# Patient Record
Sex: Male | Born: 1960 | Hispanic: Yes | Marital: Married | Smoking: Current every day smoker
Health system: Southern US, Community
[De-identification: ages and names within clinical notes are randomized; demographics above are authoritative.]

## PROBLEM LIST (undated history)

## (undated) HISTORY — PX: CHOLECYSTECTOMY: SHX55

---

## 2020-04-28 ENCOUNTER — Encounter: Payer: Self-pay | Admitting: Emergency Medicine

## 2020-04-28 ENCOUNTER — Emergency Department (INDEPENDENT_AMBULATORY_CARE_PROVIDER_SITE_OTHER): Payer: 59

## 2020-04-28 ENCOUNTER — Other Ambulatory Visit: Payer: Self-pay

## 2020-04-28 ENCOUNTER — Emergency Department (INDEPENDENT_AMBULATORY_CARE_PROVIDER_SITE_OTHER)
Admission: EM | Admit: 2020-04-28 | Discharge: 2020-04-28 | Disposition: A | Discharge status: Home / Self Care | Payer: 59 | Source: Home / Self Care

## 2020-04-28 DIAGNOSIS — R112 Nausea with vomiting, unspecified: Secondary | ICD-10-CM

## 2020-04-28 DIAGNOSIS — R1084 Generalized abdominal pain: Secondary | ICD-10-CM | POA: Diagnosis not present

## 2020-04-28 DIAGNOSIS — R197 Diarrhea, unspecified: Secondary | ICD-10-CM

## 2020-04-28 DIAGNOSIS — R300 Dysuria: Secondary | ICD-10-CM | POA: Diagnosis not present

## 2020-04-28 DIAGNOSIS — K8000 Calculus of gallbladder with acute cholecystitis without obstruction: Secondary | ICD-10-CM

## 2020-04-28 LAB — POCT CBC W AUTO DIFF (K'VILLE URGENT CARE)

## 2020-04-28 LAB — POCT URINALYSIS DIP (MANUAL ENTRY)
Bilirubin, UA: NEGATIVE
Blood, UA: NEGATIVE
Glucose, UA: NEGATIVE mg/dL
Ketones, POC UA: NEGATIVE mg/dL
Leukocytes, UA: NEGATIVE
Nitrite, UA: NEGATIVE
Protein Ur, POC: 30 mg/dL — AB
Spec Grav, UA: >=1.03 — AB (ref 1.010–1.025)
Urobilinogen, UA: 1 E.U./dL
pH, UA: 5.5 (ref 5.0–8.0)

## 2020-04-28 LAB — I-STAT CREATININE (MANUAL ENTRY): Creatinine, Ser: 1.4 — AB (ref 0.50–1.10)

## 2020-04-28 MED ORDER — IOHEXOL 300 MG/ML  SOLN
100.0000 mL | Freq: Once | INTRAMUSCULAR | Status: AC | PRN
Start: 2020-04-28 — End: 2020-04-28
  Administered 2020-04-28: 100 mL via INTRAVENOUS

## 2020-04-28 NOTE — ED Triage Notes (Signed)
Emesis x 2 days lower abdominal pain, constipation, no taste today

## 2020-04-28 NOTE — ED Provider Notes (Signed)
[facility]    CSN: [id number] Arrival date & time: 04/28/20  1419      History   Chief Complaint Chief Complaint  Patient presents with  . Emesis    HPI [name] is a 59 y.o. male.   HPI [name] is a 59 y.o. male presenting to UC with c/o vomiting 7 times over the last 48 hours, generalized abdominal pain and has not had a normal BP for 4 days. Pt feels constipated with a small amount of hard stool this morning.  He has not taken anything for his symptoms. He reports altered taste due to the vomiting. Denies cough, congestion, fever, chills, chest pain or SOB. No sick contacts or recent travel.    History reviewed. No pertinent past medical history.  There are no problems to display for this patient.   History reviewed. No pertinent surgical history.     Home Medications    Prior to Admission medications   Medication Sig Start Date End Date Taking? Authorizing Provider  bismuth subsalicylate (PEPTO BISMOL) 262 MG/15ML suspension Take 30 mLs by mouth every 6 (six) hours as needed.   Yes [provider]    Family History Family History  Problem Relation Age of Onset  . Diabetes Mother     Social History Social History   Tobacco Use  . Smoking status: Current Every Day Smoker    Packs/day: 1.00    Types: Cigarettes  . Smokeless tobacco: Never Used  Vaping Use  . Vaping Use: Never used  Substance Use Topics  . Alcohol use: Yes  . Drug use: Never     Allergies   Patient has no known allergies.   Review of Systems Review of Systems   Physical Exam Triage Vital Signs ED Triage Vitals  Enc Vitals Group     BP 04/28/20 1438 (!) 145/89     Pulse Rate 04/28/20 1438 (!) 101     Resp --      Temp 04/28/20 1438 99 F (37.2 C)     Temp Source 04/28/20 1438 Oral     SpO2 04/28/20 1438 96 %     Weight 04/28/20 1439 217 lb (98.4 kg)     Height 04/28/20 1439 5\' 6"  (1.676 m)     Head Circumference --      Peak Flow --       Pain Score 04/28/20 1438 5     Pain Loc --      Pain Edu? --      Excl. in GC? --    No data found.  Updated Vital Signs BP (!) 145/89 (BP Location: Right Arm)   Pulse (!) 101   Temp 99 F (37.2 C) (Oral)   Ht 5\' 6"  (1.676 m)   Wt 217 lb (98.4 kg)   SpO2 96%   BMI 35.02 kg/m   Visual Acuity Right Eye Distance:   Left Eye Distance:   Bilateral Distance:    Right Eye Near:   Left Eye Near:    Bilateral Near:     Physical Exam   UC Treatments / Results  Labs (all labs ordered are listed, but only abnormal results are displayed) Labs Reviewed  POCT URINALYSIS DIP (MANUAL ENTRY) - Abnormal; Notable for the following components:      Result Value   Color, UA other (*)    Spec Grav, UA >=1.030 (*)    Protein Ur, POC =30 (*)    All other components within  normal limits  URINE CULTURE  LIPASE  COMPLETE METABOLIC PANEL WITH GFR  POCT CBC W AUTO DIFF ([facility])    EKG   Radiology CT ABDOMEN PELVIS W CONTRAST  Result Date: 04/28/2020 CLINICAL DATA:  59 year old male with abdominal pain. Concern for acute diverticulitis. EXAM: CT ABDOMEN AND PELVIS WITH CONTRAST TECHNIQUE: Multidetector CT imaging of the abdomen and pelvis was performed using the standard protocol following bolus administration of intravenous contrast. CONTRAST:  142mL OMNIPAQUE IOHEXOL 300 MG/ML  SOLN COMPARISON:  None. FINDINGS: Lower chest: There are bibasilar linear atelectasis/scarring. The visualized lung bases are otherwise clear. No intra-abdominal free air or free fluid. Hepatobiliary: Diffuse fatty infiltration of the liver. No intrahepatic biliary ductal dilatation. Faint slightly high attenuating content within the gallbladder may represent noncalcified stones. There is mild thickening of the gallbladder wall versus trace pericholecystic fluid. Further evaluation with ultrasound recommended. Pancreas: Unremarkable. No pancreatic ductal dilatation or surrounding inflammatory changes.  Spleen: Normal in size without focal abnormality. Adrenals/Urinary Tract: The adrenal glands are unremarkable. The kidneys, visualized ureters, and urinary bladder appear unremarkable. Stomach/Bowel: There is slight focal protrusion of the sigmoid colon into the left inguinal canal. There is no bowel obstruction or active inflammation. The appendix is normal. Vascular/Lymphatic: The abdominal aorta and IVC unremarkable. No portal venous gas. There is no adenopathy. Reproductive: The prostate and seminal vesicles are grossly unremarkable. No pelvic mass. Other: Small fat containing bilateral inguinal hernias. Musculoskeletal: Degenerative changes of the spine. No acute osseous pathology. IMPRESSION: 1. Probable noncalcified gallstones with findings concerning for mild or early acute cholecystitis. Further evaluation with ultrasound recommended. 2. No bowel obstruction. Normal appendix. 3. Fatty liver. Electronically Signed   By: Anner Crete M.D.   On: 04/28/2020 17:23    Procedures Procedures (including critical care time)  Medications Ordered in UC Medications - No data to display  Initial Impression / Assessment and Plan / UC Course  I have reviewed the triage vital signs and the nursing notes.  Pertinent labs & imaging results that were available during my care of the patient were reviewed by me and considered in my medical decision making (see chart for details).     Discussed imaging with pt Encouraged further evaluation and treatment in the emergency department this evening. Pt understanding and agreeable to go to the hospital this evening. He feels well enough to drive himself, declined EMS transport. Pt appears well, NAD. AVS provided.  Final Clinical Impressions(s) / UC Diagnoses   Final diagnoses:  Dysuria  Nausea vomiting and diarrhea  Calculus of gallbladder with acute cholecystitis without obstruction     Discharge Instructions      Your imaging is concerning for  an infected gallbladder, which would likely need to be surgically removed. It is recommended you go to the emergency department this evening for further evaluation with an ultrasound and possible surgery. Do not eat or drink anything until you are further evaluation in the emergency department in case surgery is needed.     ED Prescriptions    None     PDMP not reviewed this encounter.   Noe Gens, [place] 04/28/20 1808

## 2020-04-28 NOTE — Discharge Instructions (Signed)
  Your imaging is concerning for an infected gallbladder, which would likely need to be surgically removed. It is recommended you go to the emergency department this evening for further evaluation with an ultrasound and possible surgery. Do not eat or drink anything until you are further evaluation in the emergency department in case surgery is needed.

## 2020-04-29 LAB — COMPLETE METABOLIC PANEL WITH GFR
AG Ratio: 1.4 (calc) (ref 1.0–2.5)
ALT: 30 U/L (ref 9–46)
AST: 18 U/L (ref 10–35)
Albumin: 4.3 g/dL (ref 3.6–5.1)
Alkaline phosphatase (APISO): 76 U/L (ref 35–144)
BUN/Creatinine Ratio: 16 (calc) (ref 6–22)
BUN: 21 mg/dL (ref 7–25)
CO2: 21 mmol/L (ref 20–32)
Calcium: 9.5 mg/dL (ref 8.6–10.3)
Chloride: 104 mmol/L (ref 98–110)
Creat: 1.32 mg/dL — ABNORMAL HIGH (ref 0.70–1.25)
GFR, Est African American: 67 mL/min/{1.73_m2} (ref >=60)
GFR, Est Non African American: 58 mL/min/{1.73_m2} — ABNORMAL LOW (ref >=60)
Globulin: 3.1 g/dL (calc) (ref 1.9–3.7)
Glucose, Bld: 140 mg/dL — ABNORMAL HIGH (ref 65–99)
Potassium: 3.8 mmol/L (ref 3.5–5.3)
Sodium: 138 mmol/L (ref 135–146)
Total Bilirubin: 1.1 mg/dL (ref 0.2–1.2)
Total Protein: 7.4 g/dL (ref 6.1–8.1)

## 2020-04-29 LAB — LIPASE: Lipase: 18 U/L (ref 7–60)

## 2020-04-30 LAB — URINE CULTURE
MICRO NUMBER:: 10616854
SPECIMEN QUALITY:: ADEQUATE

## 2020-12-26 ENCOUNTER — Emergency Department (INDEPENDENT_AMBULATORY_CARE_PROVIDER_SITE_OTHER)
Admission: EM | Admit: 2020-12-26 | Discharge: 2020-12-26 | Disposition: A | Discharge status: Home / Self Care | Payer: Self-pay | Source: Home / Self Care

## 2020-12-26 ENCOUNTER — Emergency Department (INDEPENDENT_AMBULATORY_CARE_PROVIDER_SITE_OTHER): Payer: 59

## 2020-12-26 ENCOUNTER — Other Ambulatory Visit: Payer: Self-pay

## 2020-12-26 DIAGNOSIS — M542 Cervicalgia: Secondary | ICD-10-CM | POA: Diagnosis not present

## 2020-12-26 DIAGNOSIS — S161XXA Strain of muscle, fascia and tendon at neck level, initial encounter: Secondary | ICD-10-CM

## 2020-12-26 DIAGNOSIS — M545 Low back pain, unspecified: Secondary | ICD-10-CM

## 2020-12-26 DIAGNOSIS — S39012A Strain of muscle, fascia and tendon of lower back, initial encounter: Secondary | ICD-10-CM

## 2020-12-26 MED ORDER — CYCLOBENZAPRINE HCL 10 MG PO TABS
10.0000 mg | ORAL_TABLET | Freq: Two times a day (BID) | ORAL | 0 refills | Status: DC | PRN
Start: 2020-12-26 — End: 2021-01-29

## 2020-12-26 MED ORDER — MELOXICAM 7.5 MG PO TABS
7.5000 mg | ORAL_TABLET | Freq: Every day | ORAL | 0 refills | Status: DC
Start: 2020-12-26 — End: 2021-01-29
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
## 2021-01-29 ENCOUNTER — Encounter: Payer: Self-pay | Admitting: Emergency Medicine

## 2021-01-29 ENCOUNTER — Emergency Department
Admission: EM | Admit: 2021-01-29 | Discharge: 2021-01-29 | Disposition: A | Discharge status: Home / Self Care | Payer: 59 | Source: Home / Self Care

## 2021-01-29 ENCOUNTER — Other Ambulatory Visit: Payer: Self-pay

## 2021-01-29 DIAGNOSIS — M5432 Sciatica, left side: Secondary | ICD-10-CM | POA: Diagnosis not present

## 2021-01-29 DIAGNOSIS — M25552 Pain in left hip: Secondary | ICD-10-CM

## 2021-01-29 MED ORDER — PREDNISONE 10 MG (21) PO TBPK: ORAL_TABLET | Freq: Every day | ORAL | 0 refills | Status: AC

## 2021-01-29 NOTE — ED Provider Notes (Signed)
[facility]   [id number] 01/29/21 Arrival Time: 1459  OE:HOZYY PAIN  SUBJECTIVE: History from: patient. Wyatt Boyer is a 60 y.o. male complains of left low back pain that began about 3 days ago. Reports that the pain radiates down the left buttock into the L hip and down the left leg. Reports that he has had these symptoms in the past and was treated with steroids. Denies a precipitating event or specific injury. Describes the pain as constant and achy in character, with intermittent sharp pain. Has not tried OTC medications without relief. Symptoms are made worse with activity. Denies similar symptoms in the past. Denies fever, chills, erythema, ecchymosis, effusion, weakness, numbness and tingling, saddle paresthesias, loss of bowel or bladder function.      ROS: As per HPI.  All other pertinent ROS negative.     History reviewed. No pertinent past medical history. History reviewed. No pertinent surgical history. No Known Allergies No current facility-administered medications on file prior to encounter.   Current Outpatient Medications on File Prior to Encounter  Medication Sig Dispense Refill  . bismuth subsalicylate (PEPTO BISMOL) 262 MG/15ML suspension Take 30 mLs by mouth every 6 (six) hours as needed.     Social History   Socioeconomic History  . Marital status: Married    Spouse name: Not on file  . Number of children: Not on file  . Years of education: Not on file  . Highest education level: Not on file  Occupational History  . Not on file  Tobacco Use  . Smoking status: Current Some Day Smoker    Packs/day: 1.00    Types: Cigarettes  . Smokeless tobacco: Never Used  Vaping Use  . Vaping Use: Never used  Substance and Sexual Activity  . Alcohol use: Yes    Comment: sometimes  . Drug use: Never  . Sexual activity: Not on file  Other Topics Concern  . Not on file  Social History Narrative  . Not on file   Social Determinants of Health   Financial  Resource Strain: Not on file  Food Insecurity: Not on file  Transportation Needs: Not on file  Physical Activity: Not on file  Stress: Not on file  Social Connections: Not on file  Intimate Partner Violence: Not on file   Family History  Problem Relation Age of Onset  . Diabetes Mother     OBJECTIVE:  Vitals:   01/29/21 1511 01/29/21 1513  BP: (!) 162/90   Pulse: 71   Resp: 18   Temp: 98.1 F (36.7 C)   TempSrc: Oral   SpO2: 99%   Weight:  212 lb (96.2 kg)  Height:  5\' 6"  (1.676 m)    General appearance: ALERT; in no acute distress.  Head: NCAT Lungs: Normal respiratory effort CV: pulses 2+ bilaterally. Cap refill < 2 seconds Musculoskeletal:  Inspection: Skin warm, dry, clear and intact No erythema, effusion noted Palpation: L buttock tender to deep palpation, + L SLR ROM: Limited ROM active and passive to low back and L hip with standing, sitting, changing positions Skin: warm and dry Neurologic: Ambulates without difficulty; Sensation intact about the upper/ lower extremities Psychological: alert and cooperative; normal mood and affect  DIAGNOSTIC STUDIES:  No results found.   ASSESSMENT & PLAN:  1. Left sided sciatica   2. Acute hip pain, left     Meds ordered this encounter  Medications  . predniSONE (STERAPRED UNI-PAK 21 TAB) 10 MG (21) TBPK tablet  Sig: Take by mouth daily for 6 days. Take 6 tablets on day 1, 5 tablets on day 2, 4 tablets on day 3, 3 tablets on day 4, 2 tablets on day 5, 1 tablet on day 6    Dispense:  21 tablet    Refill:  0    Order Specific Question:   Supervising Provider    Answer:   Merrilee Jansky [id number]   Steroid taper prescribed Continue conservative management of rest, ice, and gentle stretches Take ibuprofen as needed for pain relief (may cause abdominal discomfort, ulcers, and GI bleeds avoid taking with other NSAIDs) Follow up with PCP if symptoms persist Return or go to the ER if you have any new or worsening  symptoms (fever, chills, chest pain, abdominal pain, changes in bowel or bladder habits, pain radiating into lower legs)   Reviewed expectations re: course of current medical issues. Questions answered. Outlined signs and symptoms indicating need for more acute intervention. Patient verbalized understanding. After Visit Summary given.       Moshe Cipro, NP 01/29/21 1530

## 2021-01-29 NOTE — ED Triage Notes (Signed)
Left side Sciatica x 3 days

## 2021-01-29 NOTE — Discharge Instructions (Signed)
I have sent in a prednisone taper for you to take for 6 days. 6 tablets on day one, 5 tablets on day two, 4 tablets on day three, 3 tablets on day four, 2 tablets on day five, and 1 tablet on day six.  May use tylenol and/or ibuprofen as needed for pain  Follow up with this office or with primary care if symptoms are persisting.  Follow up in the ER for high fever, trouble swallowing, trouble breathing, other concerning symptoms.

## 2021-09-07 ENCOUNTER — Other Ambulatory Visit: Payer: Self-pay

## 2021-09-07 ENCOUNTER — Emergency Department (INDEPENDENT_AMBULATORY_CARE_PROVIDER_SITE_OTHER)
Admission: EM | Admit: 2021-09-07 | Discharge: 2021-09-07 | Disposition: A | Discharge status: Home / Self Care | Payer: Managed Care, Other (non HMO) | Source: Home / Self Care | Attending: Family Medicine | Admitting: Family Medicine

## 2021-09-07 DIAGNOSIS — K4090 Unilateral inguinal hernia, without obstruction or gangrene, not specified as recurrent: Secondary | ICD-10-CM

## 2021-09-07 LAB — POCT URINALYSIS DIP (MANUAL ENTRY)
Bilirubin, UA: NEGATIVE
Blood, UA: NEGATIVE
Glucose, UA: NEGATIVE mg/dL
Ketones, POC UA: NEGATIVE mg/dL
Leukocytes, UA: NEGATIVE
Nitrite, UA: NEGATIVE
Protein Ur, POC: NEGATIVE mg/dL
Spec Grav, UA: >=1.03 — AB (ref 1.010–1.025)
Urobilinogen, UA: 0.2 E.U./dL
pH, UA: 5 (ref 5.0–8.0)

## 2021-09-07 NOTE — Discharge Instructions (Signed)
Avoid heavy lifting. If symptoms become significantly worse during the night or over the weekend, proceed to the local emergency room.

## 2021-09-07 NOTE — ED Provider Notes (Signed)
[facility]    CSN: [id number] Arrival date & time: 09/07/21  1530      History   Chief Complaint Chief Complaint  Patient presents with   Groin Pain   Urinary Frequency   Back Pain    HPI Wyatt Boyer is a 60 y.o. male.   Patient complains of approximately 6 month history of an intermittent burning pain and fullness in his left groin worse with movement and climbing.  The pain does not radiate and he denies urinary symptoms.  He denies pain/swelling in his scrotum.  The history is provided by the patient.  Groin Pain This is a new problem. Episode onset: 6 months ago. Episode frequency: intermittently. Pertinent negatives include no abdominal pain. Exacerbated by: movement and climbing. He has tried nothing for the symptoms.   History reviewed. No pertinent past medical history.  There are no problems to display for this patient.   Past Surgical History:  Procedure Laterality Date   CHOLECYSTECTOMY         Home Medications    Prior to Admission medications   Medication Sig Start Date End Date Taking? Authorizing Provider  calcium carbonate (TUMS - DOSED IN MG ELEMENTAL CALCIUM) 500 MG chewable tablet Chew 1 tablet by mouth daily as needed for indigestion or heartburn.   Yes [provider]  bismuth subsalicylate (PEPTO BISMOL) 262 MG/15ML suspension Take 30 mLs by mouth every 6 (six) hours as needed.    [provider]    Family History Family History  Problem Relation Age of Onset   Diabetes Mother    Cancer Father    Alcohol abuse Father     Social History Social History   Tobacco Use   Smoking status: Every Day    Packs/day: 0.25    Years: 30.00    Pack years: 7.50    Types: Cigarettes   Smokeless tobacco: Never  Vaping Use   Vaping Use: Never used  Substance Use Topics   Alcohol use: Yes    Comment: rarely   Drug use: Never     Allergies   Patient has no known allergies.   Review of Systems Review of  Systems  Constitutional:  Negative for activity change, appetite change, chills, diaphoresis, fatigue and fever.  Eyes: Negative.   Respiratory: Negative.    Cardiovascular: Negative.   Gastrointestinal:  Negative for abdominal distention, abdominal pain, blood in stool, constipation, nausea and vomiting.  Genitourinary:  Negative for difficulty urinating, dysuria, flank pain, frequency, genital sores, penile discharge, scrotal swelling, testicular pain and urgency.  Musculoskeletal:  Negative for back pain.  Skin:  Negative for rash.  Hematological:  Negative for adenopathy.    Physical Exam Triage Vital Signs ED Triage Vitals  Enc Vitals Group     BP 09/07/21 1820 (!) 142/90     Pulse Rate 09/07/21 1820 64     Resp 09/07/21 1820 20     Temp 09/07/21 1820 97.9 F (36.6 C)     Temp Source 09/07/21 1820 Oral     SpO2 09/07/21 1820 96 %     Weight 09/07/21 1814 212 lb (96.2 kg)     Height 09/07/21 1814 5\' 6"  (1.676 m)     Head Circumference --      Peak Flow --      Pain Score 09/07/21 1813 0     Pain Loc --      Pain Edu? --      Excl. in GC? --  No data found.  Updated Vital Signs BP (!) 142/90 (BP Location: Right Arm)   Pulse 64   Temp 97.9 F (36.6 C) (Oral)   Resp 20   Ht 5\' 6"  (1.676 m)   Wt 96.2 kg   SpO2 96%   BMI 34.22 kg/m   Visual Acuity Right Eye Distance:   Left Eye Distance:   Bilateral Distance:    Right Eye Near:   Left Eye Near:    Bilateral Near:     Physical Exam Constitutional:      General: He is not in acute distress. HENT:     Head: Normocephalic.     Mouth/Throat:     Pharynx: Oropharynx is clear.  Eyes:     Pupils: Pupils are equal, round, and reactive to light.  Cardiovascular:     Rate and Rhythm: Normal rate and regular rhythm.     Heart sounds: Normal heart sounds.  Pulmonary:     Breath sounds: Normal breath sounds.  Abdominal:     General: There is no distension.     Palpations: Abdomen is soft. There is no mass.      Tenderness: There is no abdominal tenderness. There is no right CVA tenderness or left CVA tenderness.     Hernia: A hernia is present. Hernia is present in the left inguinal area.  Genitourinary:    Pubic Area: No rash.      Penis: No erythema, tenderness, discharge or lesions.      Testes: Normal.     Epididymis:     Right: Normal.     Left: Normal.       Comments: Left inguinal canal has tenderness/fullness with valsalva. Musculoskeletal:     Cervical back: Neck supple.  Lymphadenopathy:     Cervical: No cervical adenopathy.     Lower Body: No left inguinal adenopathy.  Skin:    General: Skin is warm and dry.     Findings: No rash.  Neurological:     Mental Status: He is alert and oriented to person, place, and time.     UC Treatments / Results  Labs (all labs ordered are listed, but only abnormal results are displayed) Labs Reviewed  POCT URINALYSIS DIP (MANUAL ENTRY) - Abnormal; Notable for the following components:      Result Value   Color, UA straw (*)    Spec Grav, UA >=1.030 (*)    All other components within normal limits    EKG   Radiology No results found.  Procedures Procedures (including critical care time)  Medications Ordered in UC Medications - No data to display  Initial Impression / Assessment and Plan / UC Course  I have reviewed the triage vital signs and the nursing notes.  Pertinent labs & imaging results that were available during my care of the patient were reviewed by me and considered in my medical decision making (see chart for details).    Note normal urinalysis. Recommend follow-up with general surgeon.  Final Clinical Impressions(s) / UC Diagnoses   Final diagnoses:  Left inguinal hernia     Discharge Instructions      Avoid heavy lifting. If symptoms become significantly worse during the night or over the weekend, proceed to the local emergency room.    ED Prescriptions   None       ,  MD 09/08/21 1624

## 2021-09-07 NOTE — ED Triage Notes (Signed)
Pt presents to Urgent Care with c/o intermittent L groin pain, back pain, abdominal pressure, and urinary frequency x approx 6 months.
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
## 2022-06-29 ENCOUNTER — Ambulatory Visit
Admission: EM | Admit: 2022-06-29 | Discharge: 2022-06-29 | Disposition: A | Discharge status: Home / Self Care | Payer: Managed Care, Other (non HMO) | Attending: Family Medicine | Admitting: Family Medicine

## 2022-06-29 ENCOUNTER — Encounter: Payer: Self-pay | Admitting: Emergency Medicine

## 2022-06-29 DIAGNOSIS — S39011A Strain of muscle, fascia and tendon of abdomen, initial encounter: Secondary | ICD-10-CM | POA: Diagnosis not present

## 2022-06-29 DIAGNOSIS — K5909 Other constipation: Secondary | ICD-10-CM

## 2022-06-29 MED ORDER — METHYLPREDNISOLONE 4 MG PO TBPK: ORAL_TABLET | ORAL | 0 refills | Status: AC

## 2022-06-29 MED ORDER — METHOCARBAMOL 500 MG PO TABS: 500.0000 mg | ORAL_TABLET | Freq: Three times a day (TID) | ORAL | 0 refills | Status: AC | PRN

## 2022-06-29 NOTE — ED Provider Notes (Signed)
[facility]    CSN: [id number] Arrival date & time: 06/29/22  1453      History   Chief Complaint Chief Complaint  Patient presents with   Abdominal Pain    HPI Cristina Mattern is a 61 y.o. male.   HPI 61 year old male presents with right-sided lower abdominal pain for 1 day.  Patient reports was mowing the lawn yesterday and may have pulled lower abdominal muscle while mowing grass.  History reviewed. No pertinent past medical history.  There are no problems to display for this patient.   Past Surgical History:  Procedure Laterality Date   CHOLECYSTECTOMY         Home Medications    Prior to Admission medications   Medication Sig Start Date End Date Taking? Authorizing Provider  methocarbamol (ROBAXIN) 500 MG tablet Take 1 tablet (500 mg total) by mouth 3 (three) times daily as needed for muscle spasms. 06/29/22  Yes Trevor Iha, FNP  methylPREDNISolone (MEDROL DOSEPAK) 4 MG TBPK tablet Take as directed 06/29/22  Yes Trevor Iha, FNP  bismuth subsalicylate (PEPTO BISMOL) 262 MG/15ML suspension Take 30 mLs by mouth every 6 (six) hours as needed.    [provider]  calcium carbonate (TUMS - DOSED IN MG ELEMENTAL CALCIUM) 500 MG chewable tablet Chew 1 tablet by mouth daily as needed for indigestion or heartburn.    [provider]    Family History Family History  Problem Relation Age of Onset   Diabetes Mother    Cancer Father    Alcohol abuse Father     Social History Social History   Tobacco Use   Smoking status: Every Day    Packs/day: 0.25    Years: 30.00    Total pack years: 7.50    Types: Cigarettes   Smokeless tobacco: Never  Vaping Use   Vaping Use: Never used  Substance Use Topics   Alcohol use: Yes    Comment: rarely   Drug use: Never     Allergies   Patient has no known allergies.   Review of Systems Review of Systems  Gastrointestinal:  Positive for abdominal pain.  All other systems reviewed  and are negative.    Physical Exam Triage Vital Signs ED Triage Vitals  Enc Vitals Group     BP 06/29/22 1515 113/74     Pulse Rate 06/29/22 1515 73     Resp 06/29/22 1515 18     Temp 06/29/22 1515 98.5 F (36.9 C)     Temp Source 06/29/22 1515 Oral     SpO2 06/29/22 1515 94 %     Weight 06/29/22 1517 212 lb (96.2 kg)     Height 06/29/22 1517 5\' 6"  (1.676 m)     Head Circumference --      Peak Flow --      Pain Score 06/29/22 1517 3     Pain Loc --      Pain Edu? --      Excl. in GC? --    No data found.  Updated Vital Signs BP 113/74 (BP Location: Right Arm)   Pulse 73   Temp 98.5 F (36.9 C) (Oral)   Resp 18   Ht 5\' 6"  (1.676 m)   Wt 212 lb (96.2 kg)   SpO2 94%   BMI 34.22 kg/m    Physical Exam Vitals and nursing note reviewed.  Constitutional:      Appearance: He is well-developed. He is obese.  HENT:  Head: Normocephalic and atraumatic.     Mouth/Throat:     Mouth: Mucous membranes are moist.     Pharynx: Oropharynx is clear.  Eyes:     Extraocular Movements: Extraocular movements intact.     Pupils: Pupils are equal, round, and reactive to light.  Cardiovascular:     Rate and Rhythm: Normal rate and regular rhythm.     Heart sounds: Normal heart sounds. No murmur heard. Pulmonary:     Effort: Pulmonary effort is normal.     Breath sounds: Normal breath sounds. No wheezing, rhonchi or rales.  Abdominal:     General: Abdomen is flat. Bowel sounds are absent. There is no distension.     Palpations: Abdomen is soft. There is no shifting dullness, fluid wave, hepatomegaly, splenomegaly, mass or pulsatile mass.     Tenderness: There is abdominal tenderness in the right lower quadrant. There is no right CVA tenderness, left CVA tenderness, guarding or rebound. Negative signs include Murphy's sign, Rovsing's sign and McBurney's sign.     Hernia: No hernia is present.  Skin:    General: Skin is warm and dry.  Neurological:     General: No focal deficit  present.     Mental Status: He is alert and oriented to person, place, and time.      UC Treatments / Results  Labs (all labs ordered are listed, but only abnormal results are displayed) Labs Reviewed - No data to display  EKG   Radiology No results found.  Procedures Procedures (including critical care time)  Medications Ordered in UC Medications - No data to display  Initial Impression / Assessment and Plan / UC Course  I have reviewed the triage vital signs and the nursing notes.  Pertinent labs & imaging results that were available during my care of the patient were reviewed by me and considered in my medical decision making (see chart for details).     MDM: 1.  Abdominal muscle strain, initial encounter-Rx'd Medrol Dosepak, Robaxin; 2.  Other constipation-advised/recommended OTC Benefiber daily, as needed. Advised patient to take medication as directed with food to completion.  Instructed patient to start Medrol Dosepak tomorrow morning Wednesday, 06/30/2022.  Advised may take Robaxin daily or as needed for accompanying muscle spasms.  Advised patient may pick up OTC Benefiber for constipation.  Encouraged patient to increase daily water intake while taking these medications and to improve GI motility and avoid constipation.  Advised patient if symptoms worsen and/or unresolved please follow-up with PCP or here for further evaluation.  Patient discharged home, hemodynamically stable. Final Clinical Impressions(s) / UC Diagnoses   Final diagnoses:  Abdominal muscle strain, initial encounter  Other constipation     Discharge Instructions      Advised patient to take medication as directed with food to completion.  Instructed patient to start Medrol Dosepak tomorrow morning Wednesday, 06/30/2022.  Advised may take Robaxin daily or as needed for accompanying muscle spasms.  Advised patient may pick up OTC Benefiber for constipation.  Encouraged patient to increase daily water  intake while taking these medications and to improve GI motility and avoid constipation.  Advised patient if symptoms worsen and/or unresolved please follow-up with PCP or here for further evaluation.     ED Prescriptions     Medication Sig Dispense Auth. Provider   methylPREDNISolone (MEDROL DOSEPAK) 4 MG TBPK tablet Take as directed 1 each Trevor Iha, FNP   methocarbamol (ROBAXIN) 500 MG tablet Take 1 tablet (500 mg total)  by mouth 3 (three) times daily as needed for muscle spasms. 30 tablet Trevor Iha, FNP      PDMP not reviewed this encounter.   Trevor Iha, FNP 06/29/22 1558

## 2022-06-29 NOTE — Discharge Instructions (Addendum)
Advised patient to take medication as directed with food to completion.  Instructed patient to start Medrol Dosepak tomorrow morning Wednesday, 06/30/2022.  Advised may take Robaxin daily or as needed for accompanying muscle spasms.  Advised patient may pick up OTC Benefiber for constipation.  Encouraged patient to increase daily water intake while taking these medications and to improve GI motility and avoid constipation.  Advised patient if symptoms worsen and/or unresolved please follow-up with PCP or here for further evaluation.

## 2022-06-29 NOTE — ED Triage Notes (Signed)
Patient states that he was mowing the lawn yesterday and began having RLQ pain.  No nausea, vomiting or diarrhea.  Patient unsure if he pulled a muscle or anything.  Patient denies taken any OTC meds.

## 2022-06-30 ENCOUNTER — Telehealth: Payer: Self-pay | Admitting: Emergency Medicine

## 2022-06-30 NOTE — Telephone Encounter (Signed)
Follow up call to see how Wyatt Boyer was today - feeling much better after starting medicine- no questions or concerns regarding visit
